# Patient Record
Sex: Male | Born: 1955 | Race: Black or African American | Hispanic: No | Smoking: Never smoker
Health system: Southern US, Community
[De-identification: ages and names within clinical notes are randomized; demographics above are authoritative.]

## PROBLEM LIST (undated history)

## (undated) HISTORY — PX: BACK SURGERY: SHX140

---

## 2016-04-09 ENCOUNTER — Emergency Department: Payer: PRIVATE HEALTH INSURANCE

## 2016-04-09 ENCOUNTER — Encounter: Payer: Self-pay | Admitting: Emergency Medicine

## 2016-04-09 ENCOUNTER — Emergency Department
Admission: EM | Admit: 2016-04-09 | Discharge: 2016-04-09 | Disposition: A | Discharge status: Home / Self Care | Payer: PRIVATE HEALTH INSURANCE | Attending: Emergency Medicine | Admitting: Emergency Medicine

## 2016-04-09 DIAGNOSIS — J181 Lobar pneumonia, unspecified organism: Secondary | ICD-10-CM | POA: Diagnosis not present

## 2016-04-09 DIAGNOSIS — J440 Chronic obstructive pulmonary disease with acute lower respiratory infection: Secondary | ICD-10-CM | POA: Diagnosis not present

## 2016-04-09 DIAGNOSIS — R05 Cough: Secondary | ICD-10-CM | POA: Diagnosis present

## 2016-04-09 DIAGNOSIS — J449 Chronic obstructive pulmonary disease, unspecified: Secondary | ICD-10-CM

## 2016-04-09 DIAGNOSIS — J189 Pneumonia, unspecified organism: Secondary | ICD-10-CM

## 2016-04-09 DIAGNOSIS — J209 Acute bronchitis, unspecified: Secondary | ICD-10-CM

## 2016-04-09 LAB — CBC
HCT: 39 % — ABNORMAL LOW (ref 40.0–52.0)
HEMOGLOBIN: 13.3 g/dL (ref 13.0–18.0)
MCH: 30.9 pg (ref 26.0–34.0)
MCHC: 34.1 g/dL (ref 32.0–36.0)
MCV: 90.7 fL (ref 80.0–100.0)
PLATELETS: 272 10*3/uL (ref 150–440)
RBC: 4.3 MIL/uL — AB (ref 4.40–5.90)
RDW: 16.3 % — ABNORMAL HIGH (ref 11.5–14.5)
WBC: 13.9 10*3/uL — ABNORMAL HIGH (ref 3.8–10.6)

## 2016-04-09 LAB — INFLUENZA PANEL BY PCR (TYPE A & B)
INFLAPCR: NEGATIVE
Influenza B By PCR: NEGATIVE

## 2016-04-09 LAB — COMPREHENSIVE METABOLIC PANEL
ALK PHOS: 94 U/L (ref 38–126)
ALT: 16 U/L — AB (ref 17–63)
ANION GAP: 6 (ref 5–15)
AST: 19 U/L (ref 15–41)
Albumin: 4.1 g/dL (ref 3.5–5.0)
BUN: 13 mg/dL (ref 6–20)
CALCIUM: 8.8 mg/dL — AB (ref 8.9–10.3)
CO2: 23 mmol/L (ref 22–32)
CREATININE: 0.79 mg/dL (ref 0.61–1.24)
Chloride: 108 mmol/L (ref 101–111)
Glucose, Bld: 115 mg/dL — ABNORMAL HIGH (ref 65–99)
Potassium: 4.1 mmol/L (ref 3.5–5.1)
SODIUM: 137 mmol/L (ref 135–145)
Total Bilirubin: 0.6 mg/dL (ref 0.3–1.2)
Total Protein: 7.7 g/dL (ref 6.5–8.1)

## 2016-04-09 MED ORDER — CEFTRIAXONE SODIUM-DEXTROSE 1-3.74 GM-% IV SOLR
1.0000 g | Freq: Once | INTRAVENOUS | Status: AC
  Administered 2016-04-09: 1 g via INTRAVENOUS

## 2016-04-09 MED ORDER — CEFTRIAXONE SODIUM 2 G IJ SOLR: 2.0000 g | INTRAMUSCULAR | Status: DC

## 2016-04-09 MED ORDER — CEFTRIAXONE SODIUM 1 G IJ SOLR: 1.0000 g | Freq: Once | INTRAMUSCULAR | Status: DC

## 2016-04-09 MED ORDER — AZITHROMYCIN 250 MG PO TABS: ORAL_TABLET | ORAL | 0 refills | Status: AC

## 2016-04-09 MED ORDER — OXYCODONE-ACETAMINOPHEN 5-325 MG PO TABS
1.0000 | ORAL_TABLET | Freq: Once | ORAL | Status: AC
  Administered 2016-04-09: 1 via ORAL
  Filled 2016-04-09: qty 1

## 2016-04-09 MED ORDER — PREDNISONE 20 MG PO TABS: 60.0000 mg | ORAL_TABLET | Freq: Every day | ORAL | 0 refills | Status: AC

## 2016-04-09 MED ORDER — IOPAMIDOL (ISOVUE-300) INJECTION 61%
100.0000 mL | Freq: Once | INTRAVENOUS | Status: AC | PRN
  Administered 2016-04-09: 100 mL via INTRAVENOUS

## 2016-04-09 MED ORDER — CEFTRIAXONE SODIUM-DEXTROSE 1-3.74 GM-% IV SOLR
INTRAVENOUS | Status: AC
  Administered 2016-04-09: 1 g via INTRAVENOUS
  Filled 2016-04-09: qty 50

## 2016-04-09 MED ORDER — HYDROCOD POLST-CPM POLST ER 10-8 MG/5ML PO SUER
5.0000 mL | Freq: Once | ORAL | Status: AC
  Administered 2016-04-09: 5 mL via ORAL
  Filled 2016-04-09: qty 5

## 2016-04-09 MED ORDER — HYDROCOD POLST-CPM POLST ER 10-8 MG/5ML PO SUER: 5.0000 mL | Freq: Two times a day (BID) | ORAL | 0 refills | Status: AC | PRN

## 2016-04-09 NOTE — ED Triage Notes (Addendum)
Patient ambulatory to triage with steady gait, without difficulty or distress noted; pt reports prod cough yellow sputum last few days with body aches; pt reports hx of pneumonia, here visiting and came into contact with sick family member and concerned he may have pneumonia again

## 2016-04-09 NOTE — ED Notes (Signed)
MD Brown at bedside at this time.  

## 2016-04-09 NOTE — ED Notes (Signed)

## 2016-04-09 NOTE — ED Provider Notes (Signed)
[facility], [facility] Emergency Department Provider Note   First MD Initiated Contact with Patient 04/09/16 [phone]     (approximate)  I have reviewed the triage vital signs and the nursing notes.   HISTORY  Chief Complaint Cough    HPI Darryl Moreno is a 60 y.o. male with history of pneumonia 4 this year presents to the emergency department with productive cough (yellow sputum) fever. Patient states that he's had a sick contact his granddaughter has similar symptoms. Patient states that he recently finished Levaquin approximately one week ago.   Past medical history Pneumonia 4 this year. There are no active problems to display for this patient.   Past Surgical History:  Procedure Laterality Date  . BACK SURGERY      Prior to Admission medications   Medication Sig Start Date End Date Taking? Authorizing Provider  azithromycin (ZITHROMAX Z-PAK) 250 MG tablet Take 2 tablets (500 mg) on  Day 1,  followed by 1 tablet (250 mg) once daily on Days 2 through 5. 04/09/16 04/14/16  Darci Currentandolph N Ciani Rutten, MD  chlorpheniramine-HYDROcodone [facility](TUSSIONEX PENNKINETIC ER) 10-8 MG/5ML SUER Take 5 mLs by mouth every 12 (twelve) hours as needed for cough. 04/09/16   Darci Currentandolph N Olanrewaju Osborn, MD    Allergies No known drug allergies No family history on file.  Social History Social History  Substance Use Topics  . Smoking status: Never Smoker  . Smokeless tobacco: Never Used  . Alcohol use No    Review of Systems Constitutional: No fever/chills Eyes: No visual changes. ENT: No sore throat. Cardiovascular: Positive for chest pain. Respiratory: Denies shortness of breath.Positive for cough Gastrointestinal: No abdominal pain.  No nausea, no vomiting.  No diarrhea.  No constipation. Genitourinary: Negative for dysuria. Musculoskeletal: Negative for back pain. Skin: Negative for rash. Neurological: Negative for headaches, focal weakness or numbness.  10-point ROS otherwise  negative.  ____________________________________________   PHYSICAL EXAM:  VITAL SIGNS: ED Triage Vitals [04/09/16 0217]  Enc Vitals Group     BP (!) 175/103     Pulse      Resp (!) 22     Temp 99 F (37.2 C)     Temp Source Oral     SpO2 95 %     Weight 170 lb (77.1 kg)     Height 5\' 11"  (1.803 m)     Head Circumference      Peak Flow      Pain Score 6     Pain Loc      Pain Edu?      Excl. in GC?     Constitutional: Alert and oriented. Apparent discomfort  Eyes: Conjunctivae are normal. PERRL. EOMI. Head: Atraumatic. Mouth/Throat: Mucous membranes are moist.  Oropharynx non-erythematous. Neck: No stridor.   Cardiovascular: Normal rate, regular rhythm. Good peripheral circulation. Grossly normal heart sounds. Respiratory: Normal respiratory effort.  No retractions. I lower lobe rhonchi  Gastrointestinal: Soft and nontender. No distention.  Musculoskeletal: No lower extremity tenderness nor edema. No gross deformities of extremities. Neurologic:  Normal speech and language. No gross focal neurologic deficits are appreciated.  Skin:  Skin is warm, dry and intact. No rash noted. Psychiatric: Mood and affect are normal. Speech and behavior are normal.  ____________________________________________   LABS (all labs ordered are listed, but only abnormal results are displayed)  Labs Reviewed  CBC - Abnormal; Notable for the following:       Result Value   WBC 13.9 (*)    RBC 4.30 (*)  HCT 39.0 (*)    RDW 16.3 (*)    All other components within normal limits  COMPREHENSIVE METABOLIC PANEL - Abnormal; Notable for the following:    Glucose, Bld 115 (*)    Calcium 8.8 (*)    ALT 16 (*)    All other components within normal limits  INFLUENZA PANEL BY PCR (TYPE A & B, H1N1)     RADIOLOGY I, [place] N Delance Weide, personally viewed and evaluated these images (plain radiographs) as part of my medical decision making, as well as reviewing the written report by the  radiologist.  Dg Chest 2 View  Result Date: 04/09/2016 CLINICAL DATA:  Productive cough. EXAM: CHEST  2 VIEW COMPARISON:  None. FINDINGS: Linear/ bandlike opacity anteriorly on the lateral view likely localizes to the right middle lobe. There is mild diffuse interstitial prominence. Normal heart size and mediastinal contours. No pleural fluid or pneumothorax. No acute osseous abnormality is seen, proliferative change of the right acromioclavicular joint. IMPRESSION: Right middle lobe opacity may reflect pneumonia in the appropriate clinical setting. Followup PA and lateral chest X-ray is recommended in 3-4 weeks following trial of antibiotic therapy to ensure resolution and exclude underlying malignancy. Mild diffuse interstitial prominence may be chronic, infectious or inflammatory. Electronically Signed   By: Rubye Oaks M.D.   On: 04/09/2016 02:47   Ct Chest Wo Contrast  Result Date: 04/09/2016 CLINICAL DATA:  Pneumonia. EXAM: CT CHEST WITHOUT CONTRAST TECHNIQUE: Multidetector CT imaging of the chest was performed following the standard protocol without IV contrast. COMPARISON:  Chest radiograph earlier this day. FINDINGS: Cardiovascular: Normal caliber thoracic aorta with trace atherosclerosis of the arch. Normal heart size. No pericardial fluid. Mediastinum/Nodes: Ill-defined lobular low density at the right hilum, suboptimally assessed without contrast. No enlarged mediastinal nodes. No axillary adenopathy. Thyroid gland is unremarkable. Small hiatal hernia. Lungs/Pleura: Moderate emphysema. Right middle lobe opacity on radiograph represents lobar atelectasis. Mild right upper lobe scarring. There is central bronchial thickening. No pleural fluid. Upper Abdomen: Multiple low-density lesions throughout the liver consistent with cysts. Bilateral nephrolithiasis, right greater than left. Probable cyst in the right kidney, incompletely assessed. No acute abnormality. Musculoskeletal: There are no  acute or suspicious osseous abnormalities. IMPRESSION: 1. Lobar atelectasis the right middle lobe accounting for opacity on radiograph. 2. Lobular low-density at the right hilum that is suboptimally assessed in the absence of contrast. This may simply represent hilar vascular structures, however cannot exclude adenopathy or central obstructing lesion. 3. No prior exams for comparison to evaluate for chronicity. Recommend correlation with any prior exams if available. In the absence of prior imaging, patient would benefit from contrast-enhanced CT to evaluate right hilar structures. 4. Emphysema. Electronically Signed   By: Rubye Oaks M.D.   On: 04/09/2016 03:49     Procedures     INITIAL IMPRESSION / ASSESSMENT AND PLAN / ED COURSE  Pertinent labs & imaging results that were available during my care of the patient were reviewed by me and considered in my medical decision making (see chart for details).  60 year old male presents to the emergency department for episodes of pneumonia this year. Given this repetitive case of pneumonia CT scan chest performed. Radiologist requested IV contrast secondary to possible masslike right hilar structure. Patient given Tussionex in the emergency department as well as IV ceftriaxone and prednisone will be prescribed same for home Clinical Course     ____________________________________________  FINAL CLINICAL IMPRESSION(S) / ED DIAGNOSES  Final diagnoses:  Community acquired pneumonia of right  lower lobe of lung (HCC)     MEDICATIONS GIVEN DURING THIS VISIT:  Medications  cefTRIAXone (ROCEPHIN) 1-3.74 GM-% IVPB (not administered)  cefTRIAXone (ROCEPHIN) 1 g in dextrose 5 % 50 mL IVPB (not administered)  chlorpheniramine-HYDROcodone (TUSSIONEX) 10-8 MG/5ML suspension 5 mL (5 mLs Oral Given 04/09/16 0305)     NEW OUTPATIENT MEDICATIONS STARTED DURING THIS VISIT:  New Prescriptions   AZITHROMYCIN (ZITHROMAX Z-PAK) 250 MG TABLET    Take 2  tablets (500 mg) on  Day 1,  followed by 1 tablet (250 mg) once daily on Days 2 through 5.   CHLORPHENIRAMINE-HYDROCODONE (TUSSIONEX PENNKINETIC ER) 10-8 MG/5ML SUER    Take 5 mLs by mouth every 12 (twelve) hours as needed for cough.    Modified Medications   No medications on file    Discontinued Medications   No medications on file     Note:  This document was prepared using Dragon voice recognition software and may include unintentional dictation errors.    Darci Currentandolph N Jomarie Gellis, MD 04/10/16 [phone]

## 2018-05-18 IMAGING — CT CT CHEST W/ CM
2 of 3 series · 15 of 36 positions shown, 18 images · IV contrast (iopamidol)
Comparison: Noncontrast chest CT 04/09/2016

Chest radiograph 04/09/2016

CLINICAL DATA: Productive cough. Abnormality seen on noncontrast
chest CT

EXAM:
CT CHEST WITH CONTRAST
TECHNIQUE: Multidetector CT imaging of the chest was performed during
intravenous contrast administration.
CONTRAST:  100mL 2W85VX-BJJ IOPAMIDOL (2W85VX-BJJ) INJECTION 61%

[Series 2: axial st · axial · 0.70mm/px · z∈[-512,-236]mm · 12 of 162 slices shown, 15 images]
[im 12/162  mediastinal]
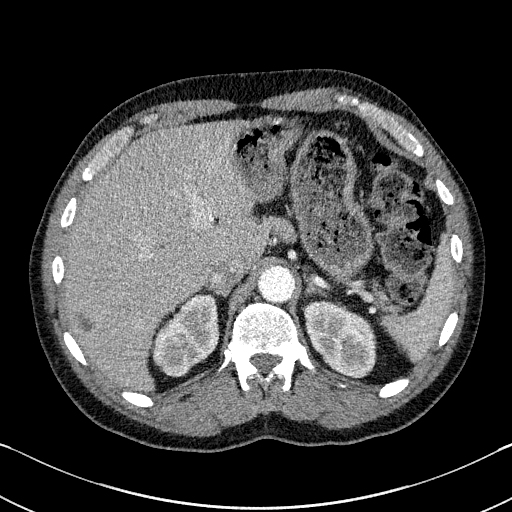
[im 12/162  lung]
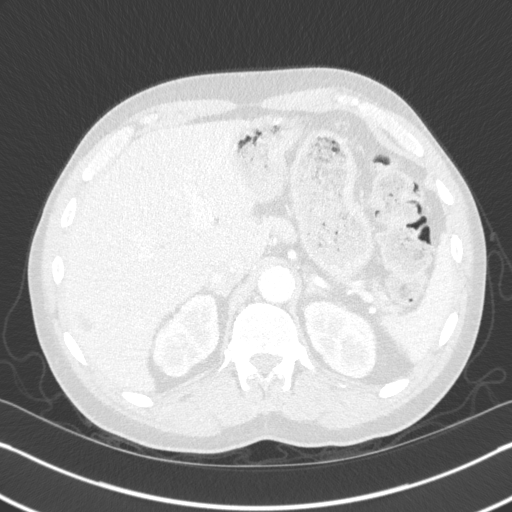
[im 24/162  lung]
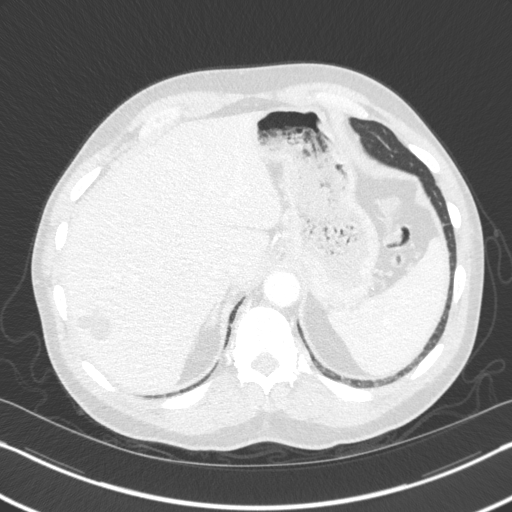
[im 36/162  lung]
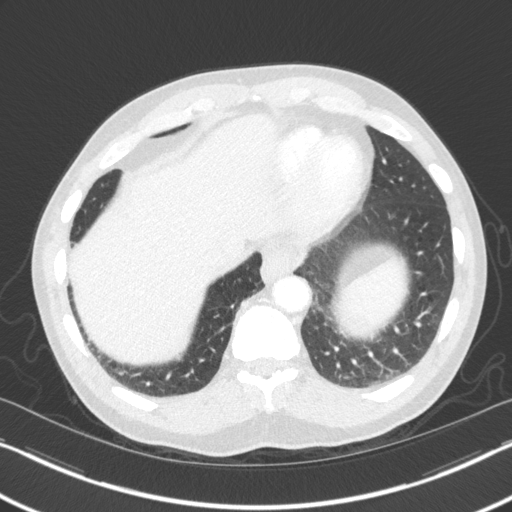
[im 48/162  lung]
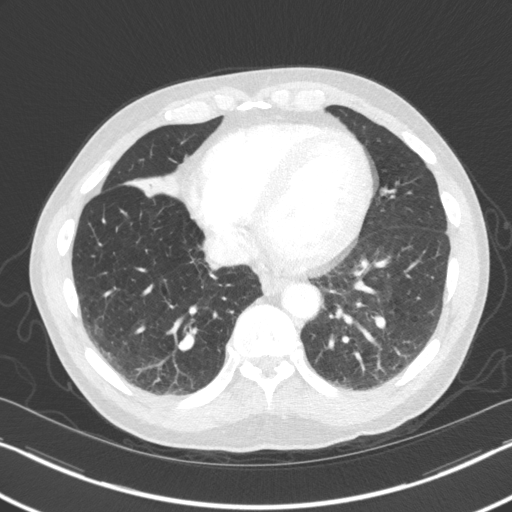
[im 60/162  mediastinal]
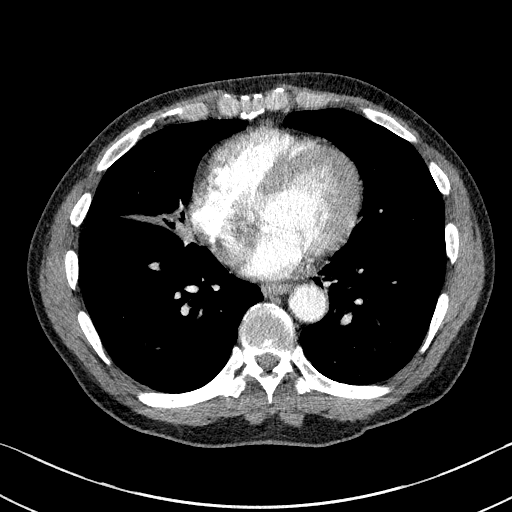
[im 60/162  lung]
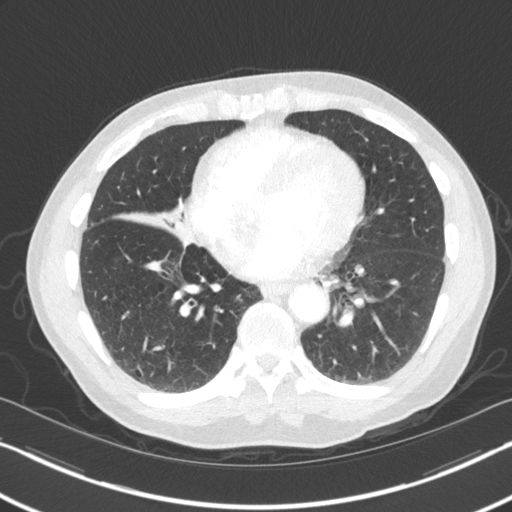
[im 72/162  lung]
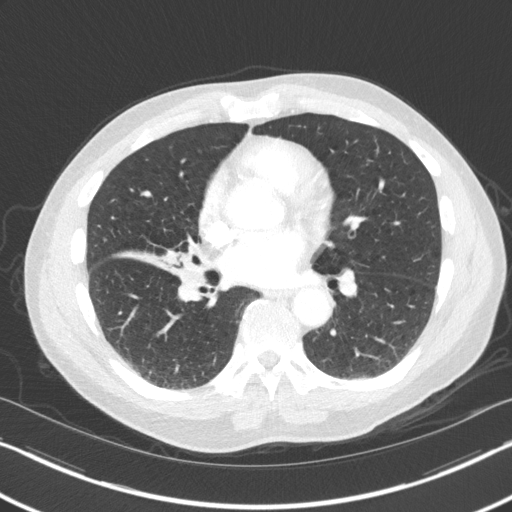
[im 90/162  lung]
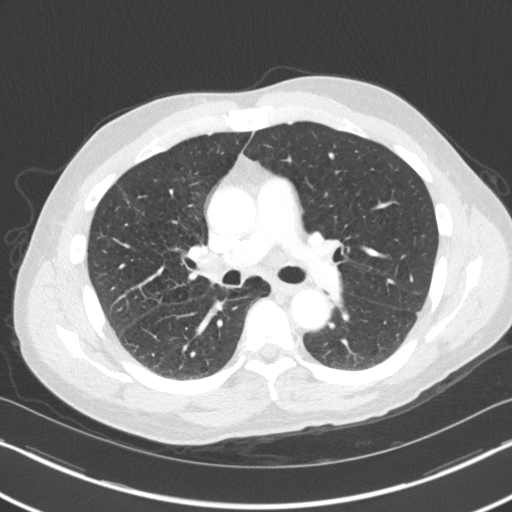
[im 102/162  lung]
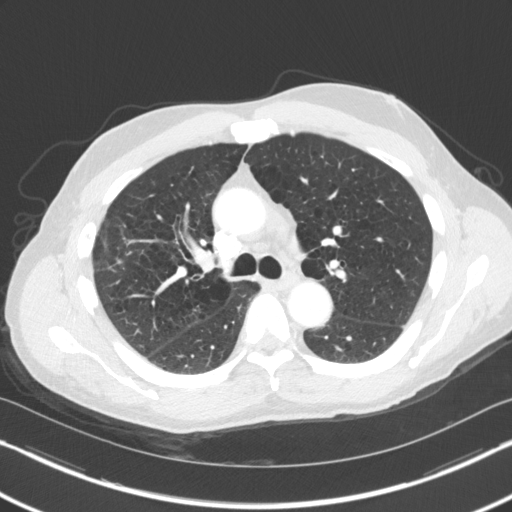
[im 114/162  mediastinal]
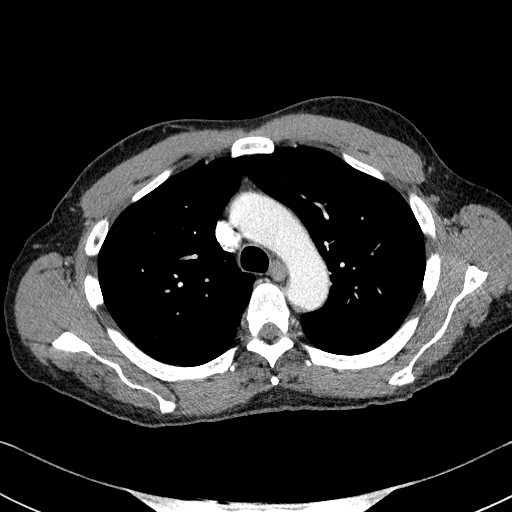
[im 114/162  lung]
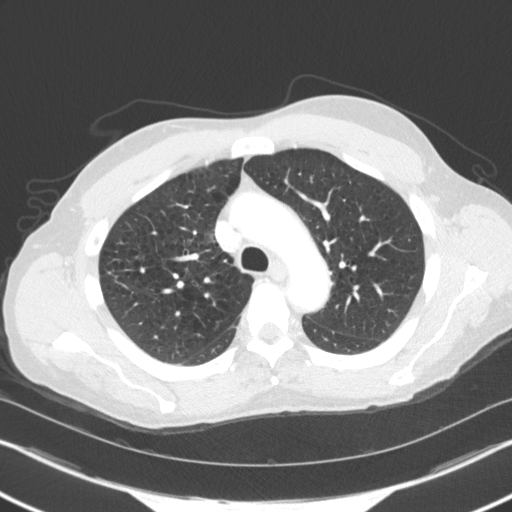
[im 126/162  lung]
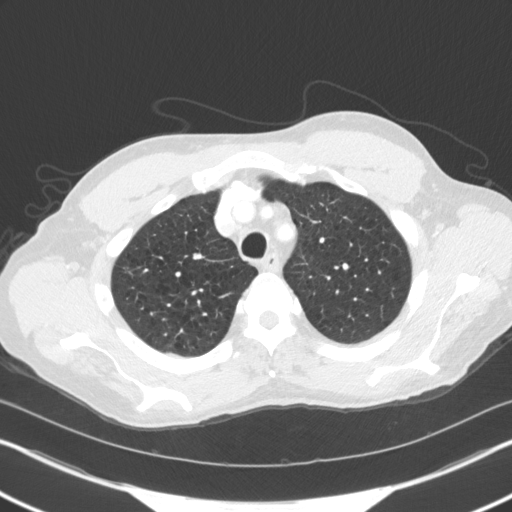
[im 138/162  lung]
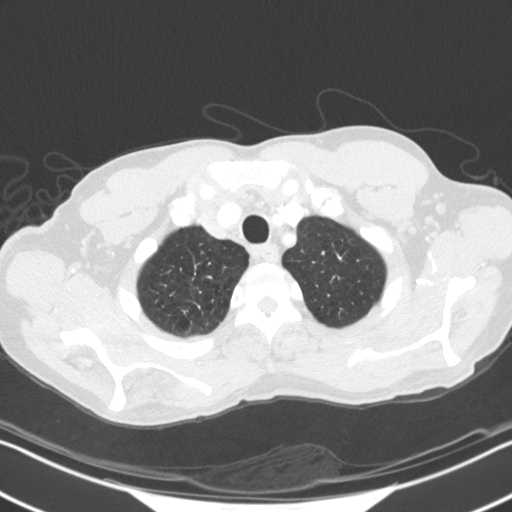
[im 150/162  lung]
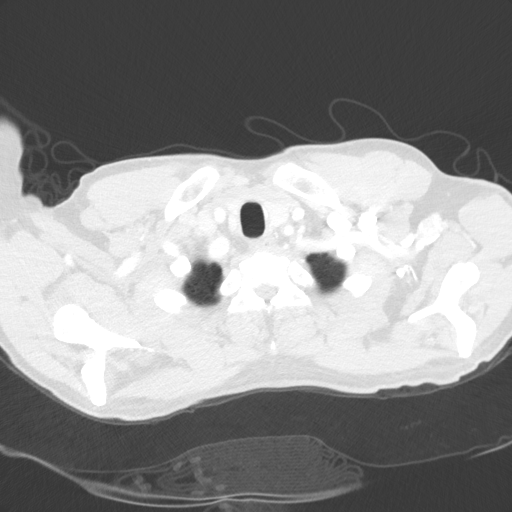

[Series 5: coronal · coronal · 0.64mm/px · 3 of 119 slices shown]
[im 24/119  lung]
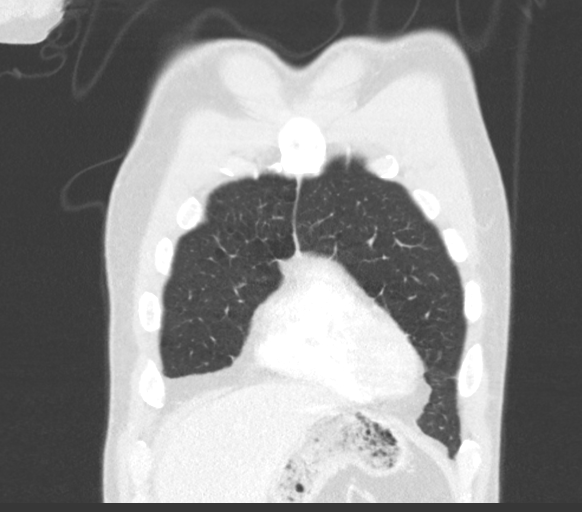
[im 48/119  lung]
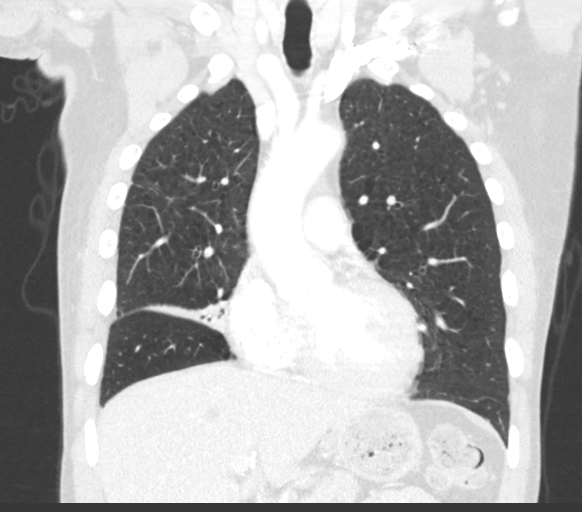
[im 71/119  lung]
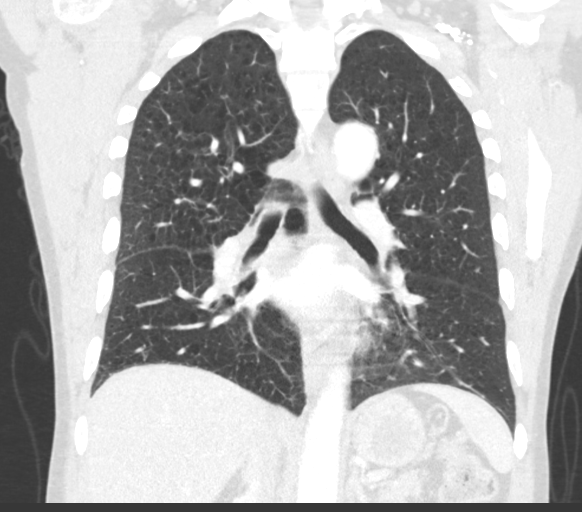

[15 of 36 positions shown; findings below may reference images not displayed]

FINDINGS: Cardiovascular: There is mild atherosclerotic calcification of the
thoracic aorta. Normal 3 vessel arch branching pattern. The central
pulmonary arteries are patent. Heart size is normal without
pericardial effusion.

Mediastinum/Nodes: The right hilar prominence described on the prior
noncontrast chest CT is shown on this study to be normal vascular
structures and adjacent atelectatic lung.

Lungs/Pleura: Bilateral emphysema. Right middle lobe atelectasis is
again noted. No pleural effusion or pneumothorax.

Upper Abdomen: Multiple low-attenuation lesions in the liver,
measuring up to 2.9 cm, most consistent with hepatic cysts or benign
biliary hamartomas. There is nonobstructing right nephrolithiasis
measuring up to 3 mm. The other visualized upper abdominal organs
are normal.

Musculoskeletal: There is no bony spinal canal stenosis. No lytic or
blastic lesions. No focal chest wall abnormality.
IMPRESSION: 1. Previously described right hilar prominence shown to be normal
vascular structures and adjacent atelectatic lung.
2. Emphysema.
3. Nonobstructive right nephrolithiasis measuring up to 3 mm.

## 2018-05-18 IMAGING — CT CT CHEST W/O CM
2 of 3 series · 15 of 36 positions shown, 18 images · non-contrast
Comparison: Chest radiograph earlier this day.

CLINICAL DATA: Pneumonia.

EXAM:
CT CHEST WITHOUT CONTRAST
TECHNIQUE: Multidetector CT imaging of the chest was performed following the
standard protocol without IV contrast.

[Series 2: thorax · axial · 0.77mm/px · z∈[-606,-304]mm · 12 of 179 slices shown, 15 images]
[im 14/179  mediastinal]
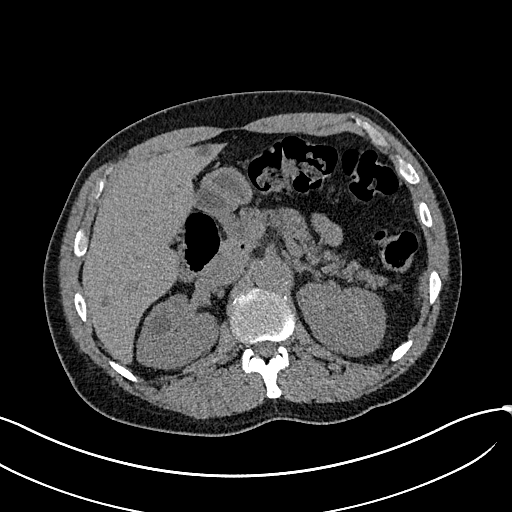
[im 14/179  lung]
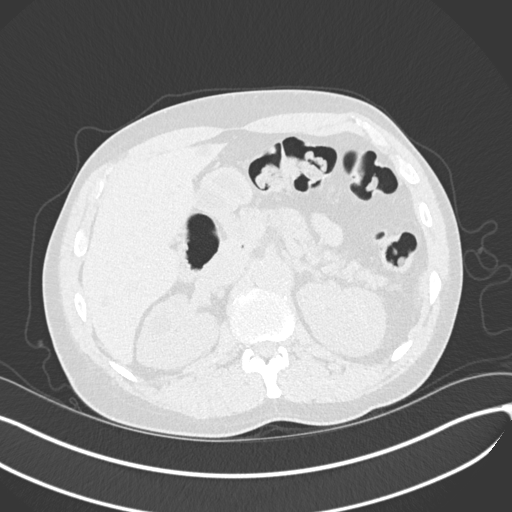
[im 27/179  lung]
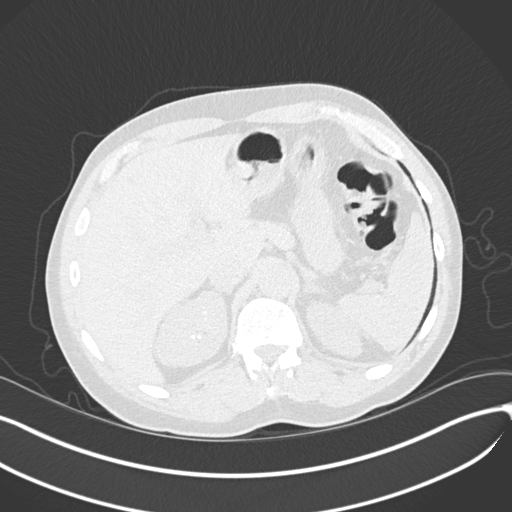
[im 40/179  lung]
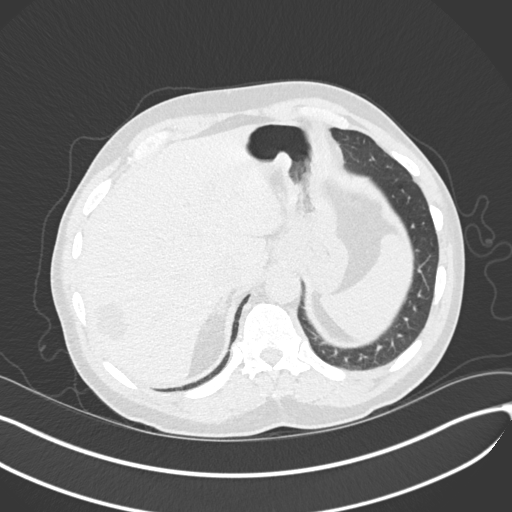
[im 53/179  lung]
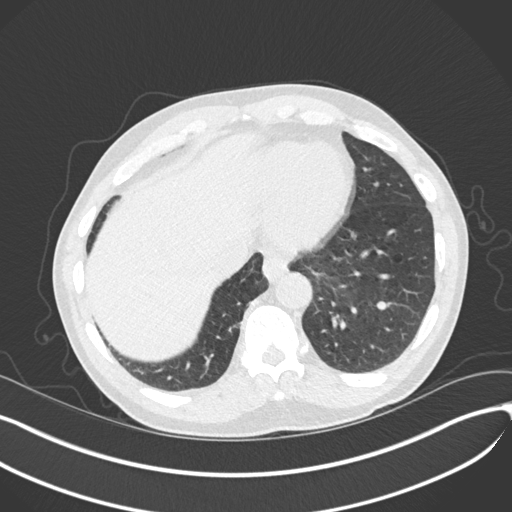
[im 66/179  mediastinal]
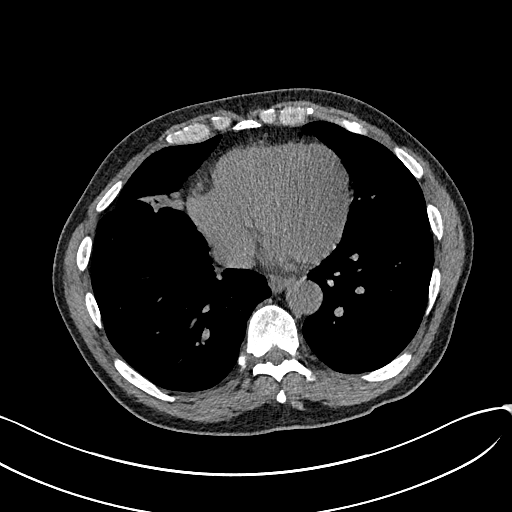
[im 66/179  lung]
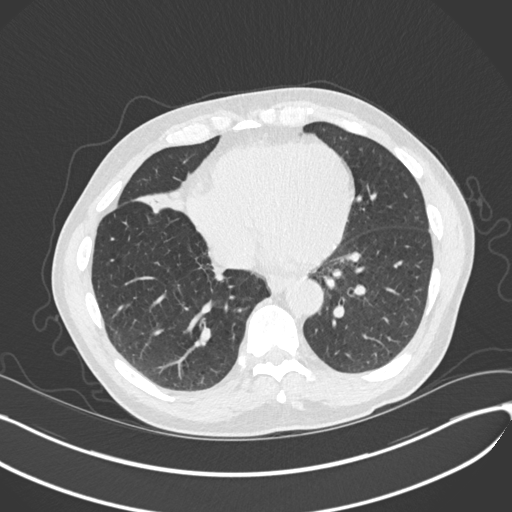
[im 80/179  lung]
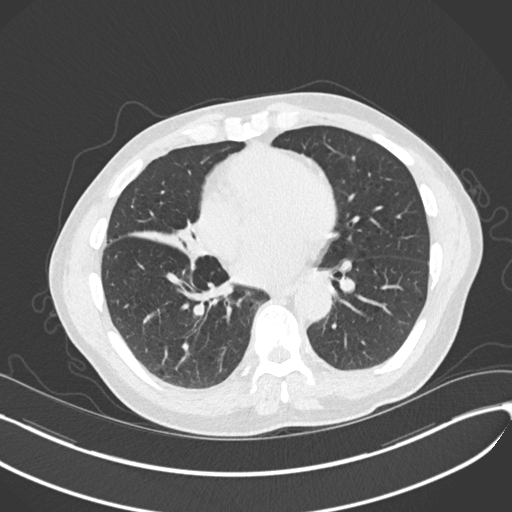
[im 99/179  lung]
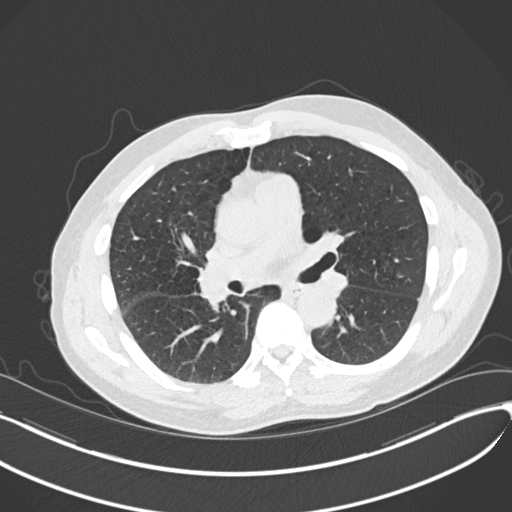
[im 113/179  lung]
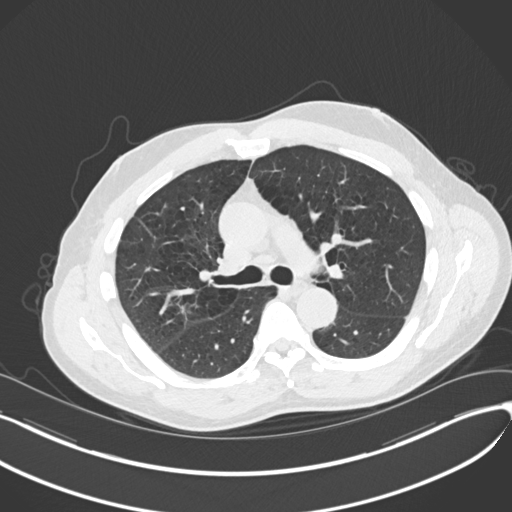
[im 126/179  mediastinal]
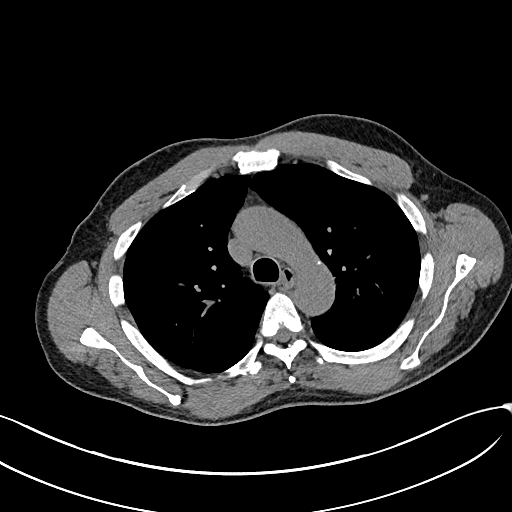
[im 126/179  lung]
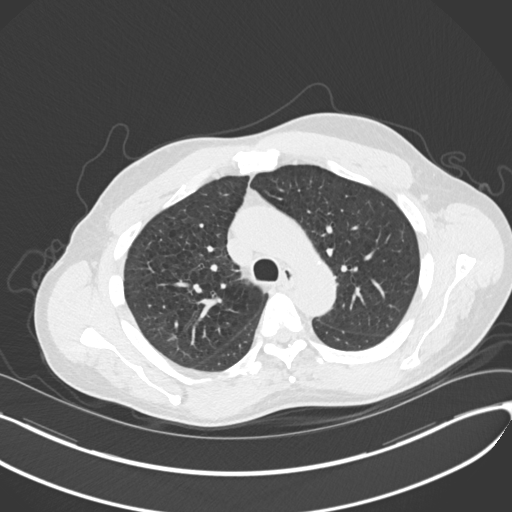
[im 139/179  lung]
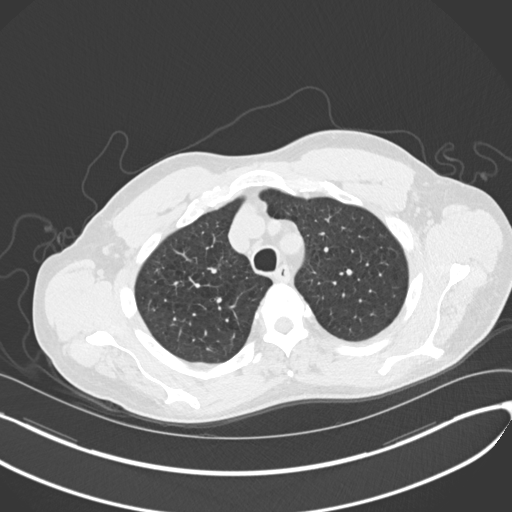
[im 152/179  lung]
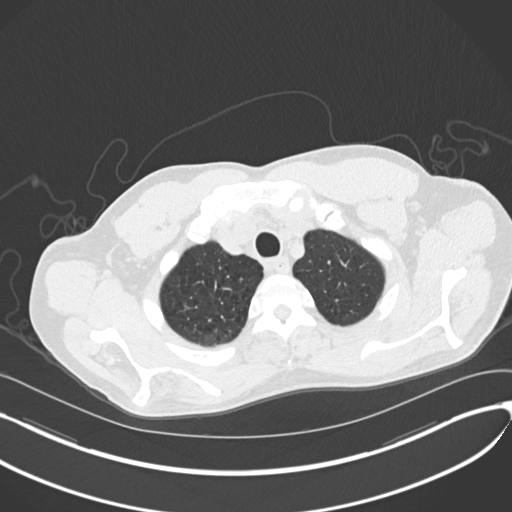
[im 165/179  lung]
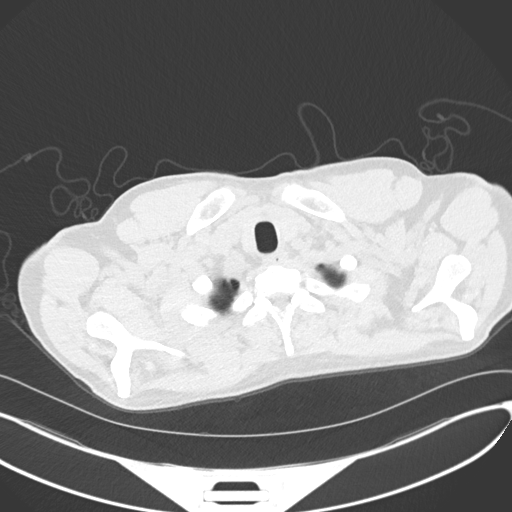

[Series 5: coronal · coronal · 0.69mm/px · 3 of 128 slices shown]
[im 26/128  lung]
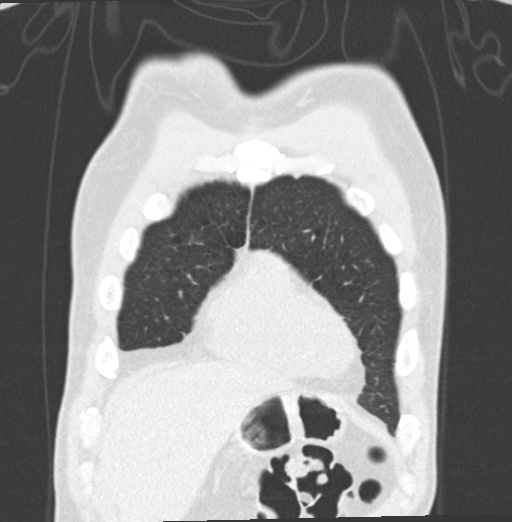
[im 51/128  lung]
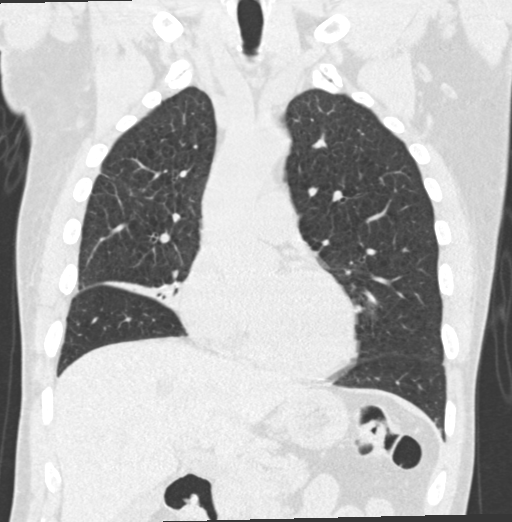
[im 77/128  lung]
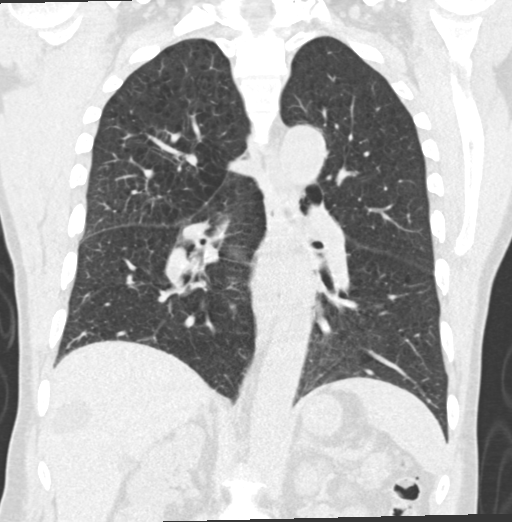

[15 of 36 positions shown; findings below may reference images not displayed]

FINDINGS: Cardiovascular: Normal caliber thoracic aorta with trace
atherosclerosis of the arch. Normal heart size. No pericardial
fluid.

Mediastinum/Nodes: Ill-defined lobular low density at the right
hilum, suboptimally assessed without contrast. No enlarged
mediastinal nodes. No axillary adenopathy. Thyroid gland is
unremarkable. Small hiatal hernia.

Lungs/Pleura: Moderate emphysema. Right middle lobe opacity on
radiograph represents lobar atelectasis. Mild right upper lobe
scarring. There is central bronchial thickening. No pleural fluid.

Upper Abdomen: Multiple low-density lesions throughout the liver
consistent with cysts. Bilateral nephrolithiasis, right greater than
left. Probable cyst in the right kidney, incompletely assessed. No
acute abnormality.

Musculoskeletal: There are no acute or suspicious osseous
abnormalities.
IMPRESSION: 1. Lobar atelectasis the right middle lobe accounting for opacity on
radiograph.
2. Lobular low-density at the right hilum that is suboptimally
assessed in the absence of contrast. This may simply represent hilar
vascular structures, however cannot exclude adenopathy or central
obstructing lesion.
3. No prior exams for comparison to evaluate for chronicity.
Recommend correlation with any prior exams if available. In the
absence of prior imaging, patient would benefit from
contrast-enhanced CT to evaluate right hilar structures.
4. Emphysema.
